# Patient Record
Sex: Male | Born: 1970 | Race: Black or African American | Hispanic: No | Marital: Married | Smoking: Never smoker
Health system: Southern US, Community
[De-identification: ages and names within clinical notes are randomized; demographics above are authoritative.]

---

## 2018-06-13 ENCOUNTER — Emergency Department (HOSPITAL_COMMUNITY): Payer: Commercial Managed Care - PPO

## 2018-06-13 ENCOUNTER — Inpatient Hospital Stay (HOSPITAL_COMMUNITY)
Admission: EM | Admit: 2018-06-13 | Discharge: 2018-06-16 | DRG: 052 | Disposition: A | Discharge status: Home Health | Payer: Commercial Managed Care - PPO | Attending: Neurological Surgery | Admitting: Neurological Surgery

## 2018-06-13 ENCOUNTER — Encounter (HOSPITAL_COMMUNITY): Payer: Self-pay | Admitting: Pharmacy Technician

## 2018-06-13 DIAGNOSIS — M5021 Other cervical disc displacement,  high cervical region: Secondary | ICD-10-CM | POA: Diagnosis present

## 2018-06-13 DIAGNOSIS — W1789XA Other fall from one level to another, initial encounter: Secondary | ICD-10-CM | POA: Diagnosis present

## 2018-06-13 DIAGNOSIS — I10 Essential (primary) hypertension: Secondary | ICD-10-CM | POA: Diagnosis present

## 2018-06-13 DIAGNOSIS — S34109A Unspecified injury to unspecified level of lumbar spinal cord, initial encounter: Secondary | ICD-10-CM | POA: Diagnosis present

## 2018-06-13 DIAGNOSIS — R52 Pain, unspecified: Secondary | ICD-10-CM

## 2018-06-13 DIAGNOSIS — F17200 Nicotine dependence, unspecified, uncomplicated: Secondary | ICD-10-CM | POA: Diagnosis present

## 2018-06-13 DIAGNOSIS — M5020 Other cervical disc displacement, unspecified cervical region: Secondary | ICD-10-CM

## 2018-06-13 DIAGNOSIS — S14129A Central cord syndrome at unspecified level of cervical spinal cord, initial encounter: Principal | ICD-10-CM | POA: Diagnosis present

## 2018-06-13 DIAGNOSIS — T380X5A Adverse effect of glucocorticoids and synthetic analogues, initial encounter: Secondary | ICD-10-CM | POA: Diagnosis not present

## 2018-06-13 DIAGNOSIS — G8222 Paraplegia, incomplete: Secondary | ICD-10-CM | POA: Diagnosis present

## 2018-06-13 DIAGNOSIS — S12000A Unspecified displaced fracture of first cervical vertebra, initial encounter for closed fracture: Secondary | ICD-10-CM | POA: Diagnosis present

## 2018-06-13 DIAGNOSIS — Y9344 Activity, trampolining: Secondary | ICD-10-CM

## 2018-06-13 DIAGNOSIS — M4802 Spinal stenosis, cervical region: Secondary | ICD-10-CM | POA: Diagnosis present

## 2018-06-13 DIAGNOSIS — Y92838 Other recreation area as the place of occurrence of the external cause: Secondary | ICD-10-CM

## 2018-06-13 DIAGNOSIS — S12001A Unspecified nondisplaced fracture of first cervical vertebra, initial encounter for closed fracture: Secondary | ICD-10-CM

## 2018-06-13 DIAGNOSIS — R739 Hyperglycemia, unspecified: Secondary | ICD-10-CM

## 2018-06-13 LAB — BASIC METABOLIC PANEL
Anion gap: 11 (ref 5–15)
BUN: 13 mg/dL (ref 6–20)
CO2: 22 mmol/L (ref 22–32)
Calcium: 9 mg/dL (ref 8.9–10.3)
Chloride: 105 mmol/L (ref 98–111)
Creatinine, Ser: 1.29 mg/dL — ABNORMAL HIGH (ref 0.61–1.24)
GFR calc Af Amer: >60 mL/min (ref >60)
GFR calc non Af Amer: >60 mL/min (ref >60)
Glucose, Bld: 125 mg/dL — ABNORMAL HIGH (ref 70–99)
Potassium: 3.6 mmol/L (ref 3.5–5.1)
Sodium: 138 mmol/L (ref 135–145)

## 2018-06-13 LAB — CBC WITH DIFFERENTIAL/PLATELET
Abs Immature Granulocytes: 0 10*3/uL (ref 0.0–0.1)
Basophils Absolute: 0.1 10*3/uL (ref 0.0–0.1)
Basophils Relative: 1 %
Eosinophils Absolute: 0.2 10*3/uL (ref 0.0–0.7)
Eosinophils Relative: 3 %
HCT: 42.5 % (ref 39.0–52.0)
Hemoglobin: 13.8 g/dL (ref 13.0–17.0)
Immature Granulocytes: 0 %
Lymphocytes Relative: 59 %
Lymphs Abs: 3.6 10*3/uL (ref 0.7–4.0)
MCH: 27.9 pg (ref 26.0–34.0)
MCHC: 32.5 g/dL (ref 30.0–36.0)
MCV: 86 fL (ref 78.0–100.0)
Monocytes Absolute: 0.8 10*3/uL (ref 0.1–1.0)
Monocytes Relative: 12 %
Neutro Abs: 1.6 10*3/uL — ABNORMAL LOW (ref 1.7–7.7)
Neutrophils Relative %: 25 %
Platelets: 217 10*3/uL (ref 150–400)
RBC: 4.94 MIL/uL (ref 4.22–5.81)
RDW: 13.8 % (ref 11.5–15.5)
WBC: 6.2 10*3/uL (ref 4.0–10.5)

## 2018-06-13 LAB — TROPONIN I: Troponin I: <0.03 ng/mL (ref <0.03)

## 2018-06-13 MED ORDER — IOPAMIDOL (ISOVUE-370) INJECTION 76%
50.0000 mL | Freq: Once | INTRAVENOUS | Status: AC | PRN
  Administered 2018-06-13: 50 mL via INTRAVENOUS

## 2018-06-13 MED ORDER — HYDROMORPHONE HCL 1 MG/ML IJ SOLN
1.0000 mg | Freq: Once | INTRAMUSCULAR | Status: AC
  Administered 2018-06-13: 1 mg via INTRAVENOUS
  Filled 2018-06-13: qty 1

## 2018-06-13 MED ORDER — LORAZEPAM 2 MG/ML IJ SOLN
1.0000 mg | Freq: Once | INTRAMUSCULAR | Status: AC | PRN
  Administered 2018-06-13: 1 mg via INTRAVENOUS
  Filled 2018-06-13: qty 1

## 2018-06-13 MED ORDER — MORPHINE SULFATE (PF) 4 MG/ML IV SOLN
4.0000 mg | Freq: Once | INTRAVENOUS | Status: AC
  Administered 2018-06-13: 4 mg via INTRAVENOUS
  Filled 2018-06-13: qty 1

## 2018-06-13 MED ORDER — LORAZEPAM 2 MG/ML IJ SOLN
1.0000 mg | Freq: Once | INTRAMUSCULAR | Status: AC | PRN
  Administered 2018-06-14: 1 mg via INTRAVENOUS
  Filled 2018-06-13 (×2): qty 1

## 2018-06-13 MED ORDER — MORPHINE SULFATE (PF) 4 MG/ML IV SOLN
6.0000 mg | Freq: Once | INTRAVENOUS | Status: AC
  Administered 2018-06-13: 6 mg via INTRAVENOUS
  Filled 2018-06-13: qty 2

## 2018-06-13 NOTE — ED Notes (Signed)
This RN accompanied pt to CT 

## 2018-06-13 NOTE — ED Notes (Signed)
Pain reported to Dr. Juleen ChinaKohut

## 2018-06-13 NOTE — ED Triage Notes (Signed)
Pt BIB EMS from trampoline park where pt was jumping and flipping. Pt landed wrong during a flip and landed on his neck. EMS reports bystanders heard a loud "popping" noise when pt landed. Pt reports +LOC. C/o pain to bil shoulders and bil arms. Pt given fentanyl en route. 138/90, HR 70, 98% RA, RR 20. 20g Lhand.

## 2018-06-13 NOTE — Progress Notes (Signed)
   06/13/18 1900  Clinical Encounter Type  Visited With Patient;Family;Health care provider  Visit Type ED  Referral From Nurse  Consult/Referral To Chaplain   Responded to a Level II.  Patient arrived and I spoke with him and he stated wife and daughters on the way. Patient being evaluated and I met the wife and family and got them situation.  Provided hospitality to the family.  Let staff know where family was waiting.  Escorted family to see the patient.  Will follow and support as needed. Chaplain Katherene Ponto

## 2018-06-13 NOTE — ED Provider Notes (Signed)
[facility]   CSN: [id number] Arrival date & time: 06/13/18  1739     History   Chief Complaint No chief complaint on file.   HPI Marcus Adkins is a 47 y.o. male.  HPI   47yM LEVEL 2 trauma.  Patient was at a trampoline park and attempted to do a flip.  He landed on his upper back/neck.  He had acute onset of neck pain in numbness and burning in bilateral upper extremities and some weakness in his right arm.  Symptoms have been persistent since onset.  He is placed in a c-collar by EMS and given 100 mg of fentanyl.  Reports that symptoms have been relatively stable.  Denies any numbness tingling or focal loss of strength elsewhere.  Denies any significant headache.  Denies any significant past medical history.  No past medical history on file.  There are no active problems to display for this patient.       Home Medications    Prior to Admission medications   Not on File    Family History No family history on file.  Social History Social History   Tobacco Use  . Smoking status: Not on file  Substance Use Topics  . Alcohol use: Not on file  . Drug use: Not on file     Allergies   Patient has no allergy information on record.   Review of Systems Review of Systems  All systems reviewed and negative, other than as noted in HPI. Physical Exam Updated Vital Signs SpO2 100%   Physical Exam  Constitutional: He appears well-developed and well-nourished. No distress.  HENT:  Head: Normocephalic and atraumatic.  Eyes: Conjunctivae are normal. Right eye exhibits no discharge. Left eye exhibits no discharge.  Neck: Neck supple.  Cardiovascular: Normal rate, regular rhythm and normal heart sounds. Exam reveals no gallop and no friction rub.  No murmur heard. Pulmonary/Chest: Effort normal and breath sounds normal. No respiratory distress.  Abdominal: Soft. He exhibits no distension. There is no  tenderness.  Musculoskeletal: He exhibits no edema or tenderness.  Cervical collar in place.  He does not have any midline tenderness in the lower cervical spine or thoracic or lumbar spine.  Neurological: He is alert.  Alert.  Oriented.  Speech clear.  Following commands.  Cranial nerves II through XII intact.  He has hyperesthesia in bilateral upper extremities, right greater than left.  This extends from his hand up into the deltoid region.  He does seem to be somewhat worse on the right side over the dorsal aspect of his forearm.  He has decreased grip strength on the right and decreased flexion and extension of the right arm.  Normal shoulder shrug.  Strength is 5 out of 5 bilateral lower extremities.  Skin: Skin is warm and dry.  Psychiatric: He has a normal mood and affect. His behavior is normal. Thought content normal.  Nursing note and vitals reviewed.    ED Treatments / Results  Labs (all labs ordered are listed, but only abnormal results are displayed) Labs Reviewed - No data to display  EKG None  Radiology Ct Head Wo Contrast  Result Date: 06/13/2018 CLINICAL DATA:  from trampoline park where pt was jumping and flipping. Pt landed wrong during a flip and landed on his neck. EMS reports bystanders heard a loud "popping" noise when pt landed. Pt reports loss of consciousness. C/o pain to bilateral shoulders and bilateral arms. EXAM: CT HEAD WITHOUT  CONTRAST CT CERVICAL SPINE WITHOUT CONTRAST TECHNIQUE: Multidetector CT imaging of the head and cervical spine was performed following the standard protocol without intravenous contrast. Multiplanar CT image reconstructions of the cervical spine were also generated. COMPARISON:  None. FINDINGS: CT HEAD FINDINGS Brain: No evidence of acute infarction, hemorrhage, hydrocephalus, extra-axial collection or mass lesion/mass effect. Vascular: No hyperdense vessel or unexpected calcification. Skull: Normal. Negative for fracture or focal lesion.  Sinuses/Orbits: There is mucosal thickening of the ethmoid sinuses. Other: None CT CERVICAL SPINE FINDINGS Alignment: There is loss of cervical lordosis. This may be secondary to splinting, soft tissue injury, or positioning. Skull base and vertebrae: There is a fracture of the superior aspect of the RIGHT transverse process at C1, involving the UPPER aspect of the transverse foramen and the groove for the vertebral artery. The posterior arch of C1 is intact. No other fractures are identified. Soft tissues and spinal canal: There is low-attenuation within a RIGHT paraspinous muscle at the level of C3. Disc levels:  Mild degenerative changes in the mid cervical spine. Upper chest: Negative. Other: None IMPRESSION: 1. Acute fracture of the RIGHT transverse process of C1 which has a close proximity with the RIGHT vertebral artery. Further evaluation with CT angiography is recommended. 2. Focal low-attenuation within a paraspinous muscle on the RIGHT at the level of C3, likely related to muscle strain. 3. No evidence for acute intracranial abnormality. 4. Sinus disease. Critical Value/emergent results were called by telephone at the time of interpretation on 06/13/2018 at 6:53 pm to Dr. Raeford Razor , who verbally acknowledged these results. Electronically Signed   By: Norva Pavlov M.D.   On: 06/13/2018 18:54   Ct Angio Neck W And/or Wo Contrast  Result Date: 06/13/2018 CLINICAL DATA:  C1 fracture affecting the transverse foramen. Assess for vertebral injury. EXAM: CT ANGIOGRAPHY NECK TECHNIQUE: Multidetector CT imaging of the neck was performed using the standard protocol during bolus administration of intravenous contrast. Multiplanar CT image reconstructions and MIPs were obtained to evaluate the vascular anatomy. Carotid stenosis measurements (when applicable) are obtained utilizing NASCET criteria, using the distal internal carotid diameter as the denominator. CONTRAST:  50mL ISOVUE-370 IOPAMIDOL  (ISOVUE-370) INJECTION 76% COMPARISON:  CT earlier same day. FINDINGS: Aortic arch: Normal. Branching pattern of the brachiocephalic vessels is normal without origin stenosis. Right carotid system: Common carotid artery is widely patent to the bifurcation. Carotid bifurcation is normal. Cervical ICA is tortuous but widely patent to and through the skull base. Left carotid system: Common carotid artery widely patent to the bifurcation. No carotid bifurcation atherosclerosis. Cervical ICA is tortuous but widely patent to and through the bifurcation. Vertebral arteries: Both vertebral artery origins are widely patent. The right vertebral artery is dominant. Both vertebral arteries are widely patent through the cervical region to the foramen magnum and the basilar. At the site of the right foramen transversarium fracture, the vertebral artery shows the most minimal contour irregularity most consistent with minor focal spasm. No evidence of a definite intimal injury or dissection. Skeleton: Right foramen transversarium fracture at C1 as noted previously. No other cervical spine fracture. Ordinary mild degenerative spondylosis C4-5, C5-6 and C6-7. Other neck: No evidence of canal hematoma. Upper chest: Negative IMPRESSION: At the site of the foramen transversarium fracture of C1 on the right, there is the most minimal contour deformity of the right vertebral artery, most consistent with minimal spasm. I do not see a definite intimal injury. The vessel is widely patent through the region. Electronically Signed  By: Paulina Fusi M.D.   On: 06/13/2018 19:51   Ct Cervical Spine Wo Contrast  Result Date: 06/13/2018 CLINICAL DATA:  from trampoline park where pt was jumping and flipping. Pt landed wrong during a flip and landed on his neck. EMS reports bystanders heard a loud "popping" noise when pt landed. Pt reports loss of consciousness. C/o pain to bilateral shoulders and bilateral arms. EXAM: CT HEAD WITHOUT CONTRAST  CT CERVICAL SPINE WITHOUT CONTRAST TECHNIQUE: Multidetector CT imaging of the head and cervical spine was performed following the standard protocol without intravenous contrast. Multiplanar CT image reconstructions of the cervical spine were also generated. COMPARISON:  None. FINDINGS: CT HEAD FINDINGS Brain: No evidence of acute infarction, hemorrhage, hydrocephalus, extra-axial collection or mass lesion/mass effect. Vascular: No hyperdense vessel or unexpected calcification. Skull: Normal. Negative for fracture or focal lesion. Sinuses/Orbits: There is mucosal thickening of the ethmoid sinuses. Other: None CT CERVICAL SPINE FINDINGS Alignment: There is loss of cervical lordosis. This may be secondary to splinting, soft tissue injury, or positioning. Skull base and vertebrae: There is a fracture of the superior aspect of the RIGHT transverse process at C1, involving the UPPER aspect of the transverse foramen and the groove for the vertebral artery. The posterior arch of C1 is intact. No other fractures are identified. Soft tissues and spinal canal: There is low-attenuation within a RIGHT paraspinous muscle at the level of C3. Disc levels:  Mild degenerative changes in the mid cervical spine. Upper chest: Negative. Other: None IMPRESSION: 1. Acute fracture of the RIGHT transverse process of C1 which has a close proximity with the RIGHT vertebral artery. Further evaluation with CT angiography is recommended. 2. Focal low-attenuation within a paraspinous muscle on the RIGHT at the level of C3, likely related to muscle strain. 3. No evidence for acute intracranial abnormality. 4. Sinus disease. Critical Value/emergent results were called by telephone at the time of interpretation on 06/13/2018 at 6:53 pm to Dr. Raeford Razor , who verbally acknowledged these results. Electronically Signed   By: Norva Pavlov M.D.   On: 06/13/2018 18:54    Procedures Procedures (including critical care time)  CRITICAL  CARE Performed by: Raeford Razor Total critical care time: 40 minutes Critical care time was exclusive of separately billable procedures and treating other patients. Critical care was necessary to treat or prevent imminent or life-threatening deterioration. Critical care was time spent personally by me on the following activities: development of treatment plan with patient and/or surrogate as well as nursing, discussions with consultants, evaluation of patient's response to treatment, examination of patient, obtaining history from patient or surrogate, ordering and performing treatments and interventions, ordering and review of laboratory studies, ordering and review of radiographic studies, pulse oximetry and re-evaluation of patient's condition.  Medications Ordered in ED Medications  HYDROmorphone (DILAUDID) injection 1 mg (has no administration in time range)     Initial Impression / Assessment and Plan / ED Course  I have reviewed the triage vital signs and the nursing notes.  Pertinent labs & imaging results that were available during my care of the patient were reviewed by me and considered in my medical decision making (see chart for details).     47yM with neck pain, hyperesthesia of upper extremities and weakness R>L. Clinically has cervical cord injury. Will place in an aspen collar. Will start with CT. Anticipate MRI and neurosurgery consultation.   Interestingly his EKG looks concerning in isolation. Clinically this is not a STEMI though.  I asked him  again about CP, dyspnea, etc which he continues to deny. No old for comparison.   Initial CT as above. Symptoms and exam more consistent with cord injury although is doesn't correspond as much with imaging. Discussed with radiology. Recommending CTa to assess vertebral artery. Will discuss with neurosurgery. Will MR neck as clinically he has a cord injury.   Discussed with Dr Danielle Dess, neurosuregry. Almira Coaster with imaging and will    Final Clinical Impressions(s) / ED Diagnoses   Final diagnoses:  Closed nondisplaced fracture of first cervical vertebra, unspecified fracture morphology, initial encounter (HCC)  Protrusion of cervical intervertebral disc    ED Discharge Orders    None       Raeford Razor, MD 06/14/18 [phone]

## 2018-06-14 ENCOUNTER — Encounter (HOSPITAL_COMMUNITY): Payer: Self-pay | Admitting: *Deleted

## 2018-06-14 ENCOUNTER — Other Ambulatory Visit: Payer: Self-pay

## 2018-06-14 ENCOUNTER — Emergency Department (HOSPITAL_COMMUNITY): Payer: Commercial Managed Care - PPO

## 2018-06-14 DIAGNOSIS — S34109A Unspecified injury to unspecified level of lumbar spinal cord, initial encounter: Secondary | ICD-10-CM | POA: Diagnosis not present

## 2018-06-14 DIAGNOSIS — T380X5A Adverse effect of glucocorticoids and synthetic analogues, initial encounter: Secondary | ICD-10-CM | POA: Diagnosis not present

## 2018-06-14 DIAGNOSIS — R52 Pain, unspecified: Secondary | ICD-10-CM | POA: Diagnosis not present

## 2018-06-14 DIAGNOSIS — Y9344 Activity, trampolining: Secondary | ICD-10-CM | POA: Diagnosis not present

## 2018-06-14 DIAGNOSIS — R739 Hyperglycemia, unspecified: Secondary | ICD-10-CM | POA: Diagnosis not present

## 2018-06-14 DIAGNOSIS — S14129A Central cord syndrome at unspecified level of cervical spinal cord, initial encounter: Secondary | ICD-10-CM | POA: Diagnosis present

## 2018-06-14 DIAGNOSIS — Y92838 Other recreation area as the place of occurrence of the external cause: Secondary | ICD-10-CM | POA: Diagnosis not present

## 2018-06-14 DIAGNOSIS — W1789XA Other fall from one level to another, initial encounter: Secondary | ICD-10-CM | POA: Diagnosis present

## 2018-06-14 DIAGNOSIS — I10 Essential (primary) hypertension: Secondary | ICD-10-CM | POA: Diagnosis present

## 2018-06-14 DIAGNOSIS — M5020 Other cervical disc displacement, unspecified cervical region: Secondary | ICD-10-CM | POA: Diagnosis not present

## 2018-06-14 DIAGNOSIS — S12000A Unspecified displaced fracture of first cervical vertebra, initial encounter for closed fracture: Secondary | ICD-10-CM | POA: Diagnosis present

## 2018-06-14 DIAGNOSIS — F17200 Nicotine dependence, unspecified, uncomplicated: Secondary | ICD-10-CM | POA: Diagnosis present

## 2018-06-14 DIAGNOSIS — G8222 Paraplegia, incomplete: Secondary | ICD-10-CM | POA: Diagnosis present

## 2018-06-14 DIAGNOSIS — M4802 Spinal stenosis, cervical region: Secondary | ICD-10-CM | POA: Diagnosis present

## 2018-06-14 DIAGNOSIS — S12001A Unspecified nondisplaced fracture of first cervical vertebra, initial encounter for closed fracture: Secondary | ICD-10-CM | POA: Diagnosis present

## 2018-06-14 DIAGNOSIS — M5021 Other cervical disc displacement,  high cervical region: Secondary | ICD-10-CM | POA: Diagnosis present

## 2018-06-14 LAB — MRSA PCR SCREENING: MRSA BY PCR: NEGATIVE

## 2018-06-14 LAB — CBC
HCT: 43 % (ref 39.0–52.0)
Hemoglobin: 13.7 g/dL (ref 13.0–17.0)
MCH: 27.6 pg (ref 26.0–34.0)
MCHC: 31.9 g/dL (ref 30.0–36.0)
MCV: 86.7 fL (ref 78.0–100.0)
PLATELETS: 224 10*3/uL (ref 150–400)
RBC: 4.96 MIL/uL (ref 4.22–5.81)
RDW: 14.3 % (ref 11.5–15.5)
WBC: 9 10*3/uL (ref 4.0–10.5)

## 2018-06-14 LAB — CREATININE, SERUM
Creatinine, Ser: 1.05 mg/dL (ref 0.61–1.24)
GFR calc Af Amer: >60 mL/min (ref >60)
GFR calc non Af Amer: >60 mL/min (ref >60)

## 2018-06-14 MED ORDER — GABAPENTIN 600 MG PO TABS
300.0000 mg | ORAL_TABLET | Freq: Four times a day (QID) | ORAL | Status: DC | PRN
  Administered 2018-06-14: 300 mg via ORAL
  Filled 2018-06-14: qty 1

## 2018-06-14 MED ORDER — SODIUM CHLORIDE 0.9 % IV SOLN
INTRAVENOUS | Status: DC
  Administered 2018-06-14: 04:00:00 via INTRAVENOUS

## 2018-06-14 MED ORDER — MORPHINE SULFATE (PF) 4 MG/ML IV SOLN
4.0000 mg | Freq: Once | INTRAVENOUS | Status: AC
  Administered 2018-06-14: 4 mg via INTRAVENOUS
  Filled 2018-06-14: qty 1

## 2018-06-14 MED ORDER — DEXAMETHASONE SODIUM PHOSPHATE 10 MG/ML IJ SOLN
10.0000 mg | Freq: Once | INTRAMUSCULAR | Status: AC
  Administered 2018-06-14: 10 mg via INTRAVENOUS
  Filled 2018-06-14: qty 1

## 2018-06-14 MED ORDER — DEXAMETHASONE 2 MG PO TABS
2.0000 mg | ORAL_TABLET | Freq: Three times a day (TID) | ORAL | Status: DC
  Administered 2018-06-14 – 2018-06-16 (×7): 2 mg via ORAL
  Filled 2018-06-14 (×7): qty 1

## 2018-06-14 MED ORDER — MORPHINE SULFATE (PF) 2 MG/ML IV SOLN
2.0000 mg | INTRAVENOUS | Status: DC | PRN
  Administered 2018-06-14 – 2018-06-16 (×15): 2 mg via INTRAVENOUS
  Filled 2018-06-14 (×15): qty 1

## 2018-06-14 MED ORDER — ENOXAPARIN SODIUM 40 MG/0.4ML ~~LOC~~ SOLN
40.0000 mg | SUBCUTANEOUS | Status: DC
  Administered 2018-06-14 – 2018-06-16 (×3): 40 mg via SUBCUTANEOUS
  Filled 2018-06-14 (×3): qty 0.4

## 2018-06-14 MED ORDER — GABAPENTIN 600 MG PO TABS
300.0000 mg | ORAL_TABLET | Freq: Three times a day (TID) | ORAL | Status: DC
  Administered 2018-06-14 – 2018-06-16 (×7): 300 mg via ORAL
  Filled 2018-06-14 (×7): qty 1

## 2018-06-14 MED ORDER — ENOXAPARIN SODIUM 40 MG/0.4ML ~~LOC~~ SOLN: 40.0000 mg | SUBCUTANEOUS | Status: DC

## 2018-06-14 NOTE — Progress Notes (Signed)
Physical Therapy Evaluation Patient Details Name: Marcus Adkins MRN: [id number] DOB: 12/03/1970 Today's Date: 06/14/2018   History of Present Illness  pt is a 47 y/o male who suffured a spinal cord injury sustained at a trampoline park when he misjudged a somersault landing on his neck and shoulders.  He immediately noted paresis/paralysis in bil UE and LE's.  CT showed C! fx, MRI shows mild to moderate stenosis C2-C7.  Clinical Impression  Pt admitted with/for spinal cord injury.  Pt's UE function significantly limited while function in the LE has largely returned.  Pt currently limited functionally due to the problems listed below.  (see problems list.)  Pt will benefit from PT to maximize function and safety to be able to get home safely with available assist.     Follow Up Recommendations Other (comment)(pt will eventually need post acute therapy,  TBA next treatm)    Equipment Recommendations  None recommended by PT    Recommendations for Other Services Rehab consult     Precautions / Restrictions Precautions Required Braces or Orthoses: Cervical Brace Cervical Brace: Hard collar;At all times Restrictions Weight Bearing Restrictions: No      Mobility  Bed Mobility               General bed mobility comments: NT  up in the chair  Transfers Overall transfer level: Needs assistance   Transfers: Sit to/from Stand Sit to Stand: Min guard         General transfer comment: no use of hands  Ambulation/Gait Ambulation/Gait assistance: Min assist;Min guard Gait Distance (Feet): 400 Feet Assistive device: None Gait Pattern/deviations: Step-through pattern   Gait velocity interpretation: <1.8 ft/sec, indicate of risk for recurrent falls General Gait Details: generally steady.  Arms dangling and held stiffly.  Pain increased in the arms, neck and shoulders with gait.  Stairs            Wheelchair Mobility    Modified Rankin (Stroke Patients Only)        Balance Overall balance assessment: Needs assistance   Sitting balance-Leahy Scale: Good       Standing balance-Leahy Scale: Fair(to good)                               Pertinent Vitals/Pain Pain Assessment: Faces Faces Pain Scale: Hurts even more Pain Location: UE's, Neck Pain Descriptors / Indicators: Pins and needles;Tingling;Discomfort;Burning Pain Intervention(s): Monitored during session;Repositioned    Home Living Family/patient expects to be discharged to:: Private residence Living Arrangements: Spouse/significant other;Children Available Help at Discharge: Family;Available 24 hours/day;Available PRN/intermittently Type of Home: House Home Access: Stairs to enter Entrance Stairs-Rails: Doctor, general practiceight;Left Entrance Stairs-Number of Steps: several Home Layout: One level Home Equipment: None      Prior Function Level of Independence: Independent         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     Hand Dominance        Extremity/Trunk Assessment   Upper Extremity Assessment Upper Extremity Assessment: Defer to OT evaluation(R significantly worse strength than L UE, )    Lower Extremity Assessment Lower Extremity Assessment: Overall WFL for tasks assessed       Communication   Communication: No difficulties  Cognition Arousal/Alertness: Awake/alert Behavior During Therapy: WFL for tasks assessed/performed Overall Cognitive Status: Within Functional Limits for tasks assessed  General Comments General comments (skin integrity, edema, etc.): Initiated and Advised cervical precautions    Exercises     Assessment/Plan    PT Assessment Patient needs continued PT services  PT Problem List Decreased strength;Decreased mobility;Decreased coordination;Decreased knowledge of precautions;Impaired sensation;Pain;Decreased activity tolerance       PT Treatment Interventions Gait  training;Stair training;Functional mobility training;Therapeutic activities;Patient/family education    PT Goals (Current goals can be found in the Care Plan section)  Acute Rehab PT Goals Patient Stated Goal: back to work,  my (UE) function back PT Goal Formulation: With patient Time For Goal Achievement: 06/28/18 Potential to Achieve Goals: Good    Frequency Min 4X/week   Barriers to discharge        Co-evaluation               AM-PAC PT "6 Clicks" Daily Activity  Outcome Measure Difficulty turning over in bed (including adjusting bedclothes, sheets and blankets)?: A Little Difficulty moving from lying on back to sitting on the side of the bed? : A Little Difficulty sitting down on and standing up from a chair with arms (e.g., wheelchair, bedside commode, etc,.)?: A Little Help needed moving to and from a bed to chair (including a wheelchair)?: A Little Help needed walking in hospital room?: A Little Help needed climbing 3-5 steps with a railing? : A Little 6 Click Score: 18    End of Session   Activity Tolerance: Patient tolerated treatment well;Patient limited by pain Patient left: in chair;with call bell/phone within reach;with family/visitor present Nurse Communication: Mobility status PT Visit Diagnosis: Other abnormalities of gait and mobility (R26.89);Pain;Other symptoms and signs involving the nervous system (R29.898) Pain - Right/Left: (BIL) Pain - part of body: Shoulder;Arm;Hand    Time: 1610-9604 PT Time Calculation (min) (ACUTE ONLY): 22 min   Charges:   PT Evaluation $PT Eval Moderate Complexity: 1 Mod     PT G Codes:        2018-06-19  Ackley Bing, PT [phone]  (pager)  Eliseo Gum Aamari West 06/19/18, 6:14 PM

## 2018-06-14 NOTE — H&P (Signed)
Marcus Adkins is an 47 y.o. male.   Chief Complaint: Severe pain and weakness in the upper extremities after trampoline injury HPI: The patient is a 47 year old individual who tells me that he was on a trampoline at a trampoline park yesterday.  He did a double somersault and misjudged his landing.  He notes that his neck was flexed when he landed on his neck and shoulders.  He felt immediately paralyzed but had been noted to move his legs at the scene he complained severely of pain in his upper extremities with severe dysesthesias and inability to move his hands is brought to the emergency department or ultimately a CT scan demonstrated the presence of a foramen transversarium fracture that was minimally displaced.  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  He is admitted now for treatment of his central cord injury  History reviewed. No pertinent past medical history.  History reviewed. No pertinent surgical history.  History reviewed. No pertinent family history. Social History:  reports that he has never smoked. He has never used smokeless tobacco. His alcohol and drug histories are not on file.  Allergies: No Known Allergies  No medications prior to admission.    Results for orders placed or performed during the hospital encounter of 06/13/18 (from the past 48 hour(s))  CBC with Differential     Status: Abnormal   Collection Time: 06/13/18  5:55 PM  Result Value Ref Range   WBC 6.2 4.0 - 10.5 K/uL   RBC 4.94 4.22 - 5.81 MIL/uL   Hemoglobin 13.8 13.0 - 17.0 g/dL   HCT 42.5 39.0 - 52.0 %   MCV 86.0 78.0 - 100.0 fL   MCH 27.9 26.0 - 34.0 pg   MCHC 32.5 30.0 - 36.0 g/dL   RDW 13.8 11.5 - 15.5 %   Platelets 217 150 - 400 K/uL   Neutrophils Relative % 25 %   Neutro Abs 1.6 (L) 1.7 - 7.7 K/uL   Lymphocytes Relative 59 %   Lymphs Abs 3.6 0.7 - 4.0 K/uL    Monocytes Relative 12 %   Monocytes Absolute 0.8 0.1 - 1.0 K/uL   Eosinophils Relative 3 %   Eosinophils Absolute 0.2 0.0 - 0.7 K/uL   Basophils Relative 1 %   Basophils Absolute 0.1 0.0 - 0.1 K/uL   Immature Granulocytes 0 %   Abs Immature Granulocytes 0.0 0.0 - 0.1 K/uL    Comment: Performed at [facility], [address] [place], [place] [address]  Basic metabolic panel     Status: Abnormal   Collection Time: 06/13/18  5:55 PM  Result Value Ref Range   Sodium 138 135 - 145 mmol/L   Potassium 3.6 3.5 - 5.1 mmol/L   Chloride 105 98 - 111 mmol/L    Comment: Please note change in reference range.   CO2 22 22 - 32 mmol/L   Glucose, Bld 125 (H) 70 - 99 mg/dL    Comment: Please note change in reference range.   BUN 13 6 - 20 mg/dL    Comment: Please note change in reference range.   Creatinine, Ser 1.29 (H) 0.61 - 1.24 mg/dL   Calcium 9.0 8.9 - 10.3 mg/dL   GFR calc non Af Amer >60 >60 mL/min   GFR calc Af Amer >60 >60 mL/min    Comment: (NOTE) The eGFR has been calculated using  the CKD EPI equation. This calculation has not been validated in all clinical situations. eGFR's persistently <60 mL/min signify possible Chronic Kidney Disease.    Anion gap 11 5 - 15    Comment: Performed at [facility] [address], [place], [place] [address]  Troponin I     Status: None   Collection Time: 06/13/18  5:55 PM  Result Value Ref Range   Troponin I <0.03 <0.03 ng/mL    Comment: Performed at [facility] [address] [place], [place] [address]  MRSA PCR Screening     Status: None   Collection Time: 06/14/18  3:21 AM  Result Value Ref Range   MRSA by PCR NEGATIVE NEGATIVE    Comment:        The GeneXpert MRSA Assay (FDA approved for NASAL specimens only), is one component of a comprehensive MRSA colonization surveillance program. It is not intended to diagnose MRSA infection nor to guide or monitor treatment for MRSA infections. Performed at [facility], [place] [address] [place] [address]   CBC     Status: None   Collection Time: 06/14/18  8:37 AM  Result Value Ref Range   WBC 9.0 4.0 - 10.5 K/uL   RBC 4.96 4.22 - 5.81 MIL/uL   Hemoglobin 13.7 13.0 - 17.0 g/dL   HCT 43.0 39.0 - 52.0 %   MCV 86.7 78.0 - 100.0 fL   MCH 27.6 26.0 - 34.0 pg   MCHC 31.9 30.0 - 36.0 g/dL   RDW 14.3 11.5 - 15.5 %   Platelets 224 150 - 400 K/uL    Comment: Performed at [facility], [place] [address], [place], [place] [address]  Creatinine, serum     Status: None   Collection Time: 06/14/18  8:37 AM  Result Value Ref Range   Creatinine, Ser 1.05 0.61 - 1.24 mg/dL   GFR calc non Af Amer >60 >60 mL/min   GFR calc Af Amer >60 >60 mL/min    Comment: (NOTE) The eGFR has been calculated using the CKD EPI equation. This calculation has not been validated in all clinical situations. eGFR's persistently <60 mL/min signify possible Chronic Kidney Disease. Performed at [facility], [place] [address] [place], [place] [address]    Ct Head Wo Contrast  Result Date: 06/13/2018 CLINICAL DATA:  from trampoline park where pt was jumping and flipping. Pt landed wrong during a flip and landed on his neck. EMS reports bystanders heard a loud "popping" noise when pt landed. Pt reports loss of consciousness. C/o pain to bilateral shoulders and bilateral arms. EXAM: CT HEAD WITHOUT CONTRAST CT CERVICAL SPINE WITHOUT CONTRAST TECHNIQUE: Multidetector CT imaging of the head and cervical spine was performed following the standard protocol without intravenous contrast. Multiplanar CT image reconstructions of the cervical spine were also generated. COMPARISON:  None. FINDINGS: CT HEAD FINDINGS Brain: No evidence of acute infarction, hemorrhage, hydrocephalus, extra-axial collection or mass lesion/mass effect. Vascular: No hyperdense vessel or unexpected calcification. Skull: Normal. Negative for fracture or focal lesion. Sinuses/Orbits: There is mucosal  thickening of the ethmoid sinuses. Other: None CT CERVICAL SPINE FINDINGS Alignment: There is loss of cervical lordosis. This may be secondary to splinting, soft tissue injury, or positioning. Skull base and vertebrae: There is a fracture of the superior aspect of the RIGHT transverse process at C1, involving the UPPER aspect of the transverse foramen and the groove for the vertebral artery. The posterior arch  of C1 is intact. No other fractures are identified. Soft tissues and spinal canal: There is low-attenuation within a RIGHT paraspinous muscle at the level of C3. Disc levels:  Mild degenerative changes in the mid cervical spine. Upper chest: Negative. Other: None IMPRESSION: 1. Acute fracture of the RIGHT transverse process of C1 which has a close proximity with the RIGHT vertebral artery. Further evaluation with CT angiography is recommended. 2. Focal low-attenuation within a paraspinous muscle on the RIGHT at the level of C3, likely related to muscle strain. 3. No evidence for acute intracranial abnormality. 4. Sinus disease. Critical Value/emergent results were called by telephone at the time of interpretation on 06/13/2018 at 6:53 pm to Dr. Virgel Manifold , who verbally acknowledged these results. Electronically Signed   By: Nolon Nations M.D.   On: 06/13/2018 18:54   Ct Angio Neck W And/or Wo Contrast  Result Date: 06/13/2018 CLINICAL DATA:  C1 fracture affecting the transverse foramen. Assess for vertebral injury. EXAM: CT ANGIOGRAPHY NECK TECHNIQUE: Multidetector CT imaging of the neck was performed using the standard protocol during bolus administration of intravenous contrast. Multiplanar CT image reconstructions and MIPs were obtained to evaluate the vascular anatomy. Carotid stenosis measurements (when applicable) are obtained utilizing NASCET criteria, using the distal internal carotid diameter as the denominator. CONTRAST:  12m ISOVUE-370 IOPAMIDOL (ISOVUE-370) INJECTION 76% COMPARISON:  CT  earlier same day. FINDINGS: Aortic arch: Normal. Branching pattern of the brachiocephalic vessels is normal without origin stenosis. Right carotid system: Common carotid artery is widely patent to the bifurcation. Carotid bifurcation is normal. Cervical ICA is tortuous but widely patent to and through the skull base. Left carotid system: Common carotid artery widely patent to the bifurcation. No carotid bifurcation atherosclerosis. Cervical ICA is tortuous but widely patent to and through the bifurcation. Vertebral arteries: Both vertebral artery origins are widely patent. The right vertebral artery is dominant. Both vertebral arteries are widely patent through the cervical region to the foramen magnum and the basilar. At the site of the right foramen transversarium fracture, the vertebral artery shows the most minimal contour irregularity most consistent with minor focal spasm. No evidence of a definite intimal injury or dissection. Skeleton: Right foramen transversarium fracture at C1 as noted previously. No other cervical spine fracture. Ordinary mild degenerative spondylosis C4-5, C5-6 and C6-7. Other neck: No evidence of canal hematoma. Upper chest: Negative IMPRESSION: At the site of the foramen transversarium fracture of C1 on the right, there is the most minimal contour deformity of the right vertebral artery, most consistent with minimal spasm. I do not see a definite intimal injury. The vessel is widely patent through the region. Electronically Signed   By: MNelson ChimesM.D.   On: 06/13/2018 19:51   Ct Cervical Spine Wo Contrast  Result Date: 06/13/2018 CLINICAL DATA:  from trampoline park where pt was jumping and flipping. Pt landed wrong during a flip and landed on his neck. EMS reports bystanders heard a loud "popping" noise when pt landed. Pt reports loss of consciousness. C/o pain to bilateral shoulders and bilateral arms. EXAM: CT HEAD WITHOUT CONTRAST CT CERVICAL SPINE WITHOUT CONTRAST  TECHNIQUE: Multidetector CT imaging of the head and cervical spine was performed following the standard protocol without intravenous contrast. Multiplanar CT image reconstructions of the cervical spine were also generated. COMPARISON:  None. FINDINGS: CT HEAD FINDINGS Brain: No evidence of acute infarction, hemorrhage, hydrocephalus, extra-axial collection or mass lesion/mass effect. Vascular: No hyperdense vessel or unexpected calcification. Skull: Normal. Negative for fracture  or focal lesion. Sinuses/Orbits: There is mucosal thickening of the ethmoid sinuses. Other: None CT CERVICAL SPINE FINDINGS Alignment: There is loss of cervical lordosis. This may be secondary to splinting, soft tissue injury, or positioning. Skull base and vertebrae: There is a fracture of the superior aspect of the RIGHT transverse process at C1, involving the UPPER aspect of the transverse foramen and the groove for the vertebral artery. The posterior arch of C1 is intact. No other fractures are identified. Soft tissues and spinal canal: There is low-attenuation within a RIGHT paraspinous muscle at the level of C3. Disc levels:  Mild degenerative changes in the mid cervical spine. Upper chest: Negative. Other: None IMPRESSION: 1. Acute fracture of the RIGHT transverse process of C1 which has a close proximity with the RIGHT vertebral artery. Further evaluation with CT angiography is recommended. 2. Focal low-attenuation within a paraspinous muscle on the RIGHT at the level of C3, likely related to muscle strain. 3. No evidence for acute intracranial abnormality. 4. Sinus disease. Critical Value/emergent results were called by telephone at the time of interpretation on 06/13/2018 at 6:53 pm to Dr. Virgel Manifold , who verbally acknowledged these results. Electronically Signed   By: Nolon Nations M.D.   On: 06/13/2018 18:54   Mr Cervical Spine Wo Contrast  Result Date: 06/14/2018 CLINICAL DATA:  Neck pain after trampoline injury EXAM:  MRI CERVICAL SPINE WITHOUT CONTRAST TECHNIQUE: Multiplanar, multisequence MR imaging of the cervical spine was performed. No intravenous contrast was administered. COMPARISON:  CTA neck 06/13/2018 FINDINGS: Alignment: Normal. Vertebrae: No vertebral body height loss or focal marrow edema. The C1 fracture demonstrated on the CT scan is not visible on this study. There is a small prevertebral effusion extending from C1-2 to C5. No ligamentous disruption. Cord: Normal caliber and signal. Posterior Fossa, vertebral arteries, paraspinal tissues: Visualized posterior fossa is normal. Vertebral artery flow voids are preserved. Disc levels: The C1-2 articulation is normal. C2-C3: Small central disc protrusion without stenosis. C3-C4: Small disc bulge narrows the ventral thecal sac. Mild spinal canal stenosis. Mild narrowing of the left neural foramen. C4-C5: Small disc bulge with mild narrowing of the spinal canal and left neural foramen. C5-C6: Large central disc extrusion with superior migration to the C5 pedicle level. This moderately narrows the spinal canal and deforms the spinal cord. No neural foraminal stenosis. C6-C7: Medium-sized central disc extrusion with inferior migration narrowing the ventral thecal sac and indenting the right anterior aspect of the spinal cord. Mild central spinal canal stenosis. No neural impingement. C7-T1: Normal. T1-T3 is imaged in the sagittal plane only, with no visible disc herniation or stenosis. IMPRESSION: 1. Known C1 fracture is not demonstrated on this study. No ligamentous disruption. Small prevertebral effusion of the upper cervical spine. 2. Multilevel spinal canal stenosis, worst at C5-C6 where there is moderate central stenosis and deformity of the spinal cord without signal change. Electronically Signed   By: Ulyses Jarred M.D.   On: 06/14/2018 01:29    Review of Systems  Constitutional: Negative.   HENT: Negative.   Eyes: Negative.   Respiratory: Negative.    Cardiovascular: Negative.   Gastrointestinal: Negative.   Genitourinary: Negative.   Musculoskeletal: Negative.   Skin: Negative.   Neurological: Negative.   Endo/Heme/Allergies: Negative.   Psychiatric/Behavioral: Negative.     Blood pressure (!) 155/91, pulse 67, temperature 98.4 F (36.9 C), temperature source Oral, resp. rate 16, height '6\' 2"'  (1.88 m), weight 104.3 kg (230 lb), SpO2 93 %. Physical Exam  Constitutional: He appears well-developed and well-nourished.  HENT:  Head: Normocephalic and atraumatic.  Eyes: Pupils are equal, round, and reactive to light. Conjunctivae and EOM are normal.  Neck:  In hard cervical collar  Cardiovascular: Normal rate and regular rhythm.  Respiratory: Effort normal and breath sounds normal.  GI: Soft. Bowel sounds are normal.  Musculoskeletal:  Tender to palpation of supraclavicular fossa's.  Limited range of motion of neck secondary to collar.  Motor strength reveals deltoids strength at 4+ out of 5 biceps strength at 4+ out of 5 triceps is 3 out of 5 wrist extensor or 3 out of 5 intrinsic function of the hands is 2 out of 5  Neurological:  Sensory function in the hands reveals severe dysesthesias in both upper extremities distally from a level of about C6 just below the [place] of the C5 nerve roots.  Skin: Skin is warm.  Psychiatric: He has a normal mood and affect. His behavior is normal. Judgment and thought content normal.     Assessment/Plan Central cord syndrome status post neck injury without fracture or instability causing weakness in the upper extremities with severe dysesthesias.  I discussed the nature of the patient's injury that ultimately he may require surgical intervention but I believe that this can be done on a delayed fashion.  Will work to control his pain have physical therapy evaluate him and plan discharge for then readmission and appropriate time.  Earleen Newport, MD 06/14/2018, 2:53 PM

## 2018-06-14 NOTE — ED Provider Notes (Signed)
Discussed MRI results with Dr. Danielle DessElsner, who will admit for disc protrusion, dysathesias, and upper extremity weakness.   Roxy HorsemanBrowning, Rakia Frayne, PA-C 06/14/18 Wendie [id number]    Raeford RazorKohut, Stephen, MD 06/14/18 [phone]

## 2018-06-14 NOTE — ED Notes (Signed)
Patient transported to MRI 

## 2018-06-15 ENCOUNTER — Encounter (HOSPITAL_COMMUNITY): Payer: Self-pay | Admitting: Physical Medicine and Rehabilitation

## 2018-06-15 DIAGNOSIS — M5020 Other cervical disc displacement, unspecified cervical region: Secondary | ICD-10-CM

## 2018-06-15 DIAGNOSIS — S12001A Unspecified nondisplaced fracture of first cervical vertebra, initial encounter for closed fracture: Secondary | ICD-10-CM

## 2018-06-15 DIAGNOSIS — R739 Hyperglycemia, unspecified: Secondary | ICD-10-CM

## 2018-06-15 DIAGNOSIS — S14129A Central cord syndrome at unspecified level of cervical spinal cord, initial encounter: Principal | ICD-10-CM

## 2018-06-15 DIAGNOSIS — T380X5A Adverse effect of glucocorticoids and synthetic analogues, initial encounter: Secondary | ICD-10-CM

## 2018-06-15 DIAGNOSIS — R52 Pain, unspecified: Secondary | ICD-10-CM

## 2018-06-15 DIAGNOSIS — I10 Essential (primary) hypertension: Secondary | ICD-10-CM

## 2018-06-15 DIAGNOSIS — S34109A Unspecified injury to unspecified level of lumbar spinal cord, initial encounter: Secondary | ICD-10-CM

## 2018-06-15 LAB — HIV ANTIBODY (ROUTINE TESTING W REFLEX): HIV Screen 4th Generation wRfx: NONREACTIVE

## 2018-06-15 MED ORDER — NICOTINE 21 MG/24HR TD PT24
21.0000 mg | MEDICATED_PATCH | Freq: Every day | TRANSDERMAL | Status: DC
  Administered 2018-06-15 – 2018-06-16 (×2): 21 mg via TRANSDERMAL
  Filled 2018-06-15 (×2): qty 1

## 2018-06-15 NOTE — Progress Notes (Signed)
Patient ID: Marcus Adkins, male   DOB: October 19, 1971, 47 y.o.   MRN: [id number] Patient complains of severe dysesthesias in both his hands and throughout his body including his lower extremities this morning.  Continues on the Decadron and Neurontin.  We will see how he does with physical therapy today.  I have asked rehabilitation medicine to evaluate him for possible CIR.  We will reevaluate later this afternoon.

## 2018-06-15 NOTE — Progress Notes (Signed)
Inpatient Rehabilitation  Met with patient at bedside and spoke with significant other via phone per his request to discuss team's recommendation for IP Rehab.  Shared booklets and answered initial questions.  Plan to follow for acute medical plans as Rehab MD recommends CIR post-intervention.  Discussed with bedside RN and charge nurse.  Call if questions.    Carmelia Roller., CCC/SLP Admission Coordinator  [facility]  Cell [phone]

## 2018-06-15 NOTE — Consult Note (Signed)
Physical Medicine and Rehabilitation Consult   Reason for Consult: Central cord syndrome with BUE weakness.  Referring Physician: Dr.Elsner.    HPI: Marcus Adkins is a 47 y.o. male in good health who landed on his neck while jumping and flipping at trampoline park, heard a loud noise and had brief LOC. History taken from chart review and patient. He reported bilateral shoulder and arm pain with numbness and weakness. CT head reviewed, unremarkable for acute intracranial process. Work up revealed C1 transverse process, focal right paraspinous muscle strain, multilevel cervical stenosis, large central disc extrusion C5/C6 with cord deformity and moderate canal stenosis. Dr. Danielle DessElsner evaluated patient and recommended therapy as well as surgery in the future. He was started on decadron to help with inflammation and Neurontin added for severe dysesthesias. He continues to have BUE weakness and CIR recommended by MD.    Review of Systems  Constitutional: Negative for chills and fever.  HENT: Negative for hearing loss and tinnitus.   Eyes: Negative for blurred vision and double vision.  Respiratory: Negative for cough and shortness of breath.   Cardiovascular: Negative for chest pain and palpitations.  Gastrointestinal: Positive for constipation. Negative for heartburn and nausea.  Genitourinary: Negative for dysuria, frequency and urgency.  Musculoskeletal: Positive for joint pain and myalgias.       Bilateral shoulder pain  Skin: Negative for rash.  Neurological: Positive for sensory change, focal weakness and weakness. Negative for dizziness and headaches.  Psychiatric/Behavioral: Negative for memory loss.  All other systems reviewed and are negative.    No past medical history.   No past surgical history.    Family History  Problem Relation Age of Onset  . Diabetes Mother   . Cerebral palsy Sister   . Cerebral palsy Brother     Social History:  Lives with fiancee and 2  young children. 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 He  reports that he smokes 1/2 PPD.   He has never used smokeless tobacco. He does not use alcohol or illicit drugs.     Allergies: No Known Allergies    No medications prior to admission.    Home: Home Living Family/patient expects to be discharged to:: Private residence Living Arrangements: Spouse/significant other, Children Available Help at Discharge: Family, Available 24 hours/day, Available PRN/intermittently Type of Home: House Home Access: Stairs to enter Entergy CorporationEntrance Stairs-Number of Steps: several Entrance Stairs-Rails: Right, Left Home Layout: One level Bathroom Shower/Tub: Engineer, manufacturing systemsTub/shower unit Bathroom Toilet: Standard Home Equipment: None  Functional History: Prior Function Level of Independence: Independent Comments: works Best boyoperating big equipment Functional Status:  Mobility: Bed Mobility General bed mobility comments: NT  up in the chair Transfers Overall transfer level: Needs assistance Transfers: Sit to/from Stand Sit to Stand: Min guard General transfer comment: no use of hands Ambulation/Gait Ambulation/Gait assistance: Min assist, Min guard Gait Distance (Feet): 400 Feet Assistive device: None Gait Pattern/deviations: Step-through pattern General Gait Details: generally steady.  Arms dangling and held stiffly.  Pain increased in the arms, neck and shoulders with gait. Gait velocity interpretation: <1.8 ft/sec, indicate of risk for recurrent falls    ADL:    Cognition: Cognition Overall Cognitive Status: Within Functional Limits for tasks assessed Orientation Level: Oriented X4 Cognition Arousal/Alertness: Awake/alert Behavior During Therapy: WFL for tasks assessed/performed Overall Cognitive Status: Within Functional Limits for tasks assessed   Blood pressure (!) 171/81, pulse 71, temperature 98.1 F (36.7 C), resp. rate 16, height 6\' 2"  (1.88 m), weight 104.3  kg (230 lb), SpO2 98  %. Physical Exam  Nursing note and vitals reviewed. Constitutional: He is oriented to person, place, and time. He appears well-developed and well-nourished.  HENT:  Head: Normocephalic and atraumatic.  Eyes: EOM are normal. Right eye exhibits no discharge. Left eye exhibits no discharge.  Neck:  +C-collar  Cardiovascular: Normal rate and regular rhythm.  Respiratory: Effort normal and breath sounds normal.  GI: Soft. Bowel sounds are normal.  Musculoskeletal:  No edema or tenderness in extremities  Neurological: He is alert and oriented to person, place, and time.  Speech clear.  Motor: RUE: Shoulder abduction, elbow flex/ext, wrist ext 2-/5, hand grip 1+/5 (pain inhibition) LUE: Shoulder abduction, elbow flex/ext, wrist ext 4-/5, hand grip 2+/5 (pain inhibition) B/l LE: 4+/5 proximal to distal Sensation diminished to light touch b/l UE Severe dysesthesias RUE>LUE  Skin: Skin is warm and dry.  Psychiatric: He has a normal mood and affect. His behavior is normal. Thought content normal.    No results found for this or any previous visit (from the past 24 hour(s)). Ct Head Wo Contrast  Result Date: 06/13/2018 CLINICAL DATA:  from trampoline park where pt was jumping and flipping. Pt landed wrong during a flip and landed on his neck. EMS reports bystanders heard a loud "popping" noise when pt landed. Pt reports loss of consciousness. C/o pain to bilateral shoulders and bilateral arms. EXAM: CT HEAD WITHOUT CONTRAST CT CERVICAL SPINE WITHOUT CONTRAST TECHNIQUE: Multidetector CT imaging of the head and cervical spine was performed following the standard protocol without intravenous contrast. Multiplanar CT image reconstructions of the cervical spine were also generated. COMPARISON:  None. FINDINGS: CT HEAD FINDINGS Brain: No evidence of acute infarction, hemorrhage, hydrocephalus, extra-axial collection or mass lesion/mass effect. Vascular: No hyperdense vessel or unexpected calcification.  Skull: Normal. Negative for fracture or focal lesion. Sinuses/Orbits: There is mucosal thickening of the ethmoid sinuses. Other: None CT CERVICAL SPINE FINDINGS Alignment: There is loss of cervical lordosis. This may be secondary to splinting, soft tissue injury, or positioning. Skull base and vertebrae: There is a fracture of the superior aspect of the RIGHT transverse process at C1, involving the UPPER aspect of the transverse foramen and the groove for the vertebral artery. The posterior arch of C1 is intact. No other fractures are identified. Soft tissues and spinal canal: There is low-attenuation within a RIGHT paraspinous muscle at the level of C3. Disc levels:  Mild degenerative changes in the mid cervical spine. Upper chest: Negative. Other: None IMPRESSION: 1. Acute fracture of the RIGHT transverse process of C1 which has a close proximity with the RIGHT vertebral artery. Further evaluation with CT angiography is recommended. 2. Focal low-attenuation within a paraspinous muscle on the RIGHT at the level of C3, likely related to muscle strain. 3. No evidence for acute intracranial abnormality. 4. Sinus disease. Critical Value/emergent results were called by telephone at the time of interpretation on 06/13/2018 at 6:53 pm to Dr. Raeford Razor , who verbally acknowledged these results. Electronically Signed   By: Norva Pavlov M.D.   On: 06/13/2018 18:54   Ct Angio Neck W And/or Wo Contrast  Result Date: 06/13/2018 CLINICAL DATA:  C1 fracture affecting the transverse foramen. Assess for vertebral injury. EXAM: CT ANGIOGRAPHY NECK TECHNIQUE: Multidetector CT imaging of the neck was performed using the standard protocol during bolus administration of intravenous contrast. Multiplanar CT image reconstructions and MIPs were obtained to evaluate the vascular anatomy. Carotid stenosis measurements (when applicable) are obtained utilizing NASCET  criteria, using the distal internal carotid diameter as the  denominator. CONTRAST:  50mL ISOVUE-370 IOPAMIDOL (ISOVUE-370) INJECTION 76% COMPARISON:  CT earlier same day. FINDINGS: Aortic arch: Normal. Branching pattern of the brachiocephalic vessels is normal without origin stenosis. Right carotid system: Common carotid artery is widely patent to the bifurcation. Carotid bifurcation is normal. Cervical ICA is tortuous but widely patent to and through the skull base. Left carotid system: Common carotid artery widely patent to the bifurcation. No carotid bifurcation atherosclerosis. Cervical ICA is tortuous but widely patent to and through the bifurcation. Vertebral arteries: Both vertebral artery origins are widely patent. The right vertebral artery is dominant. Both vertebral arteries are widely patent through the cervical region to the foramen magnum and the basilar. At the site of the right foramen transversarium fracture, the vertebral artery shows the most minimal contour irregularity most consistent with minor focal spasm. No evidence of a definite intimal injury or dissection. Skeleton: Right foramen transversarium fracture at C1 as noted previously. No other cervical spine fracture. Ordinary mild degenerative spondylosis C4-5, C5-6 and C6-7. Other neck: No evidence of canal hematoma. Upper chest: Negative IMPRESSION: At the site of the foramen transversarium fracture of C1 on the right, there is the most minimal contour deformity of the right vertebral artery, most consistent with minimal spasm. I do not see a definite intimal injury. The vessel is widely patent through the region. Electronically Signed   By: Paulina Fusi M.D.   On: 06/13/2018 19:51   Ct Cervical Spine Wo Contrast  Result Date: 06/13/2018 CLINICAL DATA:  from trampoline park where pt was jumping and flipping. Pt landed wrong during a flip and landed on his neck. EMS reports bystanders heard a loud "popping" noise when pt landed. Pt reports loss of consciousness. C/o pain to bilateral shoulders  and bilateral arms. EXAM: CT HEAD WITHOUT CONTRAST CT CERVICAL SPINE WITHOUT CONTRAST TECHNIQUE: Multidetector CT imaging of the head and cervical spine was performed following the standard protocol without intravenous contrast. Multiplanar CT image reconstructions of the cervical spine were also generated. COMPARISON:  None. FINDINGS: CT HEAD FINDINGS Brain: No evidence of acute infarction, hemorrhage, hydrocephalus, extra-axial collection or mass lesion/mass effect. Vascular: No hyperdense vessel or unexpected calcification. Skull: Normal. Negative for fracture or focal lesion. Sinuses/Orbits: There is mucosal thickening of the ethmoid sinuses. Other: None CT CERVICAL SPINE FINDINGS Alignment: There is loss of cervical lordosis. This may be secondary to splinting, soft tissue injury, or positioning. Skull base and vertebrae: There is a fracture of the superior aspect of the RIGHT transverse process at C1, involving the UPPER aspect of the transverse foramen and the groove for the vertebral artery. The posterior arch of C1 is intact. No other fractures are identified. Soft tissues and spinal canal: There is low-attenuation within a RIGHT paraspinous muscle at the level of C3. Disc levels:  Mild degenerative changes in the mid cervical spine. Upper chest: Negative. Other: None IMPRESSION: 1. Acute fracture of the RIGHT transverse process of C1 which has a close proximity with the RIGHT vertebral artery. Further evaluation with CT angiography is recommended. 2. Focal low-attenuation within a paraspinous muscle on the RIGHT at the level of C3, likely related to muscle strain. 3. No evidence for acute intracranial abnormality. 4. Sinus disease. Critical Value/emergent results were called by telephone at the time of interpretation on 06/13/2018 at 6:53 pm to Dr. Raeford Razor , who verbally acknowledged these results. Electronically Signed   By: Norva Pavlov M.D.   On:  06/13/2018 18:54   Mr Cervical Spine Wo  Contrast  Result Date: 06/14/2018 CLINICAL DATA:  Neck pain after trampoline injury EXAM: MRI CERVICAL SPINE WITHOUT CONTRAST TECHNIQUE: Multiplanar, multisequence MR imaging of the cervical spine was performed. No intravenous contrast was administered. COMPARISON:  CTA neck 06/13/2018 FINDINGS: Alignment: Normal. Vertebrae: No vertebral body height loss or focal marrow edema. The C1 fracture demonstrated on the CT scan is not visible on this study. There is a small prevertebral effusion extending from C1-2 to C5. No ligamentous disruption. Cord: Normal caliber and signal. Posterior Fossa, vertebral arteries, paraspinal tissues: Visualized posterior fossa is normal. Vertebral artery flow voids are preserved. Disc levels: The C1-2 articulation is normal. C2-C3: Small central disc protrusion without stenosis. C3-C4: Small disc bulge narrows the ventral thecal sac. Mild spinal canal stenosis. Mild narrowing of the left neural foramen. C4-C5: Small disc bulge with mild narrowing of the spinal canal and left neural foramen. C5-C6: Large central disc extrusion with superior migration to the C5 pedicle level. This moderately narrows the spinal canal and deforms the spinal cord. No neural foraminal stenosis. C6-C7: Medium-sized central disc extrusion with inferior migration narrowing the ventral thecal sac and indenting the right anterior aspect of the spinal cord. Mild central spinal canal stenosis. No neural impingement. C7-T1: Normal. T1-T3 is imaged in the sagittal plane only, with no visible disc herniation or stenosis. IMPRESSION: 1. Known C1 fracture is not demonstrated on this study. No ligamentous disruption. Small prevertebral effusion of the upper cervical spine. 2. Multilevel spinal canal stenosis, worst at C5-C6 where there is moderate central stenosis and deformity of the spinal cord without signal change. Electronically Signed   By: Deatra Robinson M.D.   On: 06/14/2018 01:29   Assessment/Plan: Diagnosis:  Central cord syndrome with incomplete paraparesis Labs and images (see above) independently reviewed.  Records reviewed and summated above.  1. Does the need for close, 24 hr/day medical supervision in concert with the patient's rehab needs make it unreasonable for this patient to be served in a less intensive setting? Potentially 2. Co-Morbidities requiring supervision/potential complications: HTN (monitor and provide prns in accordance with increased physical exertion and pain), steroid induced hyperglycemia (Monitor in accordance with exercise and adjust meds as necessary), pain (Biofeedback training with therapies to help reduce reliance on opiate pain medications, particularly IV morphine, monitor pain control during therapies, and sedation at rest and titrate to maximum efficacy to ensure participation and gains in therapies) 3. Due to safety, skin/wound care, disease management, pain management and patient education, does the patient require 24 hr/day rehab nursing? Yes 4. Does the patient require coordinated care of a physician, rehab nurse, PT (1-2 hrs/day, 5 days/week), OT (1-2 hrs/day, 5 days/week) and SLP (1-2 hrs/day, 5 days/week) to address physical and functional deficits in the context of the above medical diagnosis(es)? Yes Addressing deficits in the following areas: balance, endurance, locomotion, strength, transferring, bathing, dressing, toileting, swallowing and psychosocial support 5. Can the patient actively participate in an intensive therapy program of at least 3 hrs of therapy per day at least 5 days per week? Potentially 6. The potential for patient to make measurable gains while on inpatient rehab is good 7. Anticipated functional outcomes upon discharge from inpatient rehab are modified independent and supervision  with PT, supervision and min assist with OT, independent and modified independent with SLP. 8. Estimated rehab length of stay to reach the above functional goals  is: 4-7 days 9. Anticipated D/C setting: Home 10. Anticipated post D/C  treatments: HH therapy and Home excercise program 11. Overall Rehab/Functional Prognosis: excellent  RECOMMENDATIONS: This patient's condition is appropriate for continued rehabilitative care in the following setting: Will monitor progress in therapies and await plans for surgical intervention.  Pt appears to be doing relatively well on day of evaluation with plans for surgery in future.  If pt continues to do well with therapies, he may be better served with CIR post-intervention. Patient has agreed to participate in recommended program. Potentially Note that insurance prior authorization may be required for reimbursement for recommended care.  Comment: Rehab Admissions Coordinator to follow up.   I have personally performed a face to face diagnostic evaluation, including, but not limited to relevant history and physical exam findings, of this patient and developed relevant assessment and plan.  Additionally, I have reviewed and concur with the physician assistant's documentation above.   Maryla Morrow, MD, ABPMR Delle Reining, PA-C 06/15/2018

## 2018-06-15 NOTE — Progress Notes (Signed)
Physical Therapy Treatment Patient Details Name: Marcus Adkins MRN: [id number] DOB: 08-Dec-1970 Today's Date: 06/15/2018    History of Present Illness pt is a 47 y/o male who suffured a spinal cord injury sustained at a trampoline park when he misjudged a somersault landing on his neck and shoulders.  He immediately noted paresis/paralysis in bil UE and LE's.  CT showed C1 fx, MRI shows mild to moderate stenosis C2-C7.    PT Comments    Pt progressing with overall mobility, ambulated over 400' with supervision as well as ascending and descending a flight of stairs. However, no noted improvement in use of UE's, continues to have pain, dysesthesias, and weakness. Recommend waiting for CIR until after pt has surgery as this would allow for maximum recovery. If pt is going to go home before surgery or not have surgery, recommend outpt PT when more time has gone by to allow for nerve healing. PT will continue to follow.    Follow Up Recommendations  Other (comment)(recommend CIR if pt is going to have surgery)     Equipment Recommendations  None recommended by PT    Recommendations for Other Services Rehab consult     Precautions / Restrictions Precautions Precautions: Cervical Precaution Booklet Issued: No Precaution Comments: discussed proper posture Required Braces or Orthoses: Cervical Brace Cervical Brace: Hard collar;At all times Restrictions Weight Bearing Restrictions: No    Mobility  Bed Mobility Overal bed mobility: Needs Assistance Bed Mobility: Supine to Sit;Sit to Supine     Supine to sit: Supervision Sit to supine: Supervision   General bed mobility comments: [facility] and return to same position, discussed avoiding fetal position in bed to keep neck in proper alignment  Transfers Overall transfer level: Needs assistance Equipment used: None Transfers: Sit to/from Stand Sit to Stand: Supervision         General transfer  comment: pt stood safely, but reports increased pain in hands when he gets up and moves  Ambulation/Gait Ambulation/Gait assistance: Min guard Gait Distance (Feet): 500 Feet Assistive device: None Gait Pattern/deviations: Step-through pattern Gait velocity: decreased Gait velocity interpretation: 1.31 - 2.62 ft/sec, indicative of limited community ambulator General Gait Details: placed hands in different positions while ambulating to try to decrease pain but when pt held hands up with elbows bent had pain between shoulder blades, preferred arms down by his sides but no arm swing and  felt numbness and tingling   Stairs Stairs: Yes Stairs assistance: Min guard Stair Management: One rail Left;Alternating pattern;Forwards Number of Stairs: 10 General stair comments: pt with sufficient LE strength to navigate stairs with minimal use of L hand on rail.    Wheelchair Mobility    Modified Rankin (Stroke Patients Only)       Balance Overall balance assessment: Needs assistance Sitting-balance support: No upper extremity supported Sitting balance-Leahy Scale: Normal     Standing balance support: No upper extremity supported Standing balance-Leahy Scale: Fair(to good) Standing balance comment: balance limited by pain and neck brace                            Cognition Arousal/Alertness: Awake/alert Behavior During Therapy: WFL for tasks assessed/performed Overall Cognitive Status: Within Functional Limits for tasks assessed  Exercises      General Comments General comments (skin integrity, edema, etc.): pt's UE's hypersensitive to touch, tolerated deep pressure better than light touch, instructed him to work on deep pressure to each arm and get his girlfriend to help him with this      Pertinent Vitals/Pain Pain Assessment: Faces Faces Pain Scale: Hurts even more Pain Location: UE's, Neck Pain Descriptors /  Indicators: Pins and needles;Tingling;Discomfort;Burning Pain Intervention(s): Limited activity within patient's tolerance;Monitored during session    Home Living                      Prior Function            PT Goals (current goals can now be found in the care plan section) Acute Rehab PT Goals Patient Stated Goal: back to work,  my (UE) function back PT Goal Formulation: With patient Time For Goal Achievement: 06/28/18 Potential to Achieve Goals: Good Progress towards PT goals: Progressing toward goals    Frequency    Min 4X/week      PT Plan Current plan remains appropriate    Co-evaluation              AM-PAC PT "6 Clicks" Daily Activity  Outcome Measure  Difficulty turning over in bed (including adjusting bedclothes, sheets and blankets)?: A Little Difficulty moving from lying on back to sitting on the side of the bed? : A Little Difficulty sitting down on and standing up from a chair with arms (e.g., wheelchair, bedside commode, etc,.)?: A Little Help needed moving to and from a bed to chair (including a wheelchair)?: A Little Help needed walking in hospital room?: A Little Help needed climbing 3-5 steps with a railing? : A Little 6 Click Score: 18    End of Session Equipment Utilized During Treatment: Gait belt Activity Tolerance: Patient tolerated treatment well Patient left: with call bell/phone within reach;in bed Nurse Communication: Mobility status PT Visit Diagnosis: Other abnormalities of gait and mobility (R26.89);Pain;Other symptoms and signs involving the nervous system (R29.898) Pain - Right/Left: (BIL) Pain - part of body: Shoulder;Arm;Hand     Time: 1538-1600 PT Time Calculation (min) (ACUTE ONLY): 22 min  Charges:  $Gait Training: 8-22 mins                    G Codes:       Lyanne Co, PT  Acute Rehab Services  [phone]    [address] 06/15/2018, 4:52 PM

## 2018-06-16 MED ORDER — GABAPENTIN 600 MG PO TABS: 300.0000 mg | ORAL_TABLET | Freq: Three times a day (TID) | ORAL | 5 refills | Status: AC

## 2018-06-16 MED ORDER — DEXAMETHASONE 1 MG PO TABS: ORAL_TABLET | ORAL | 0 refills | Status: DC

## 2018-06-16 MED ORDER — IBUPROFEN 200 MG PO TABS
600.0000 mg | ORAL_TABLET | Freq: Four times a day (QID) | ORAL | Status: DC | PRN
  Administered 2018-06-16: 600 mg via ORAL
  Filled 2018-06-16: qty 3
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
## 2018-06-16 NOTE — Progress Notes (Signed)
Kim called back and will speak to Dr. Danielle DessElsner.  Nurse updated patient and family.
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
## 2018-06-16 NOTE — Progress Notes (Signed)
Pt is medically stable for discharge, will have to come back for surgery at a later date.   [place] and needs his significant other for his wallet and keys to get in the house.   Fiance gets off at 1700 and will come and take patient home.   Pt to be discharged home with home health for PT and OT.

## 2018-06-16 NOTE — Progress Notes (Addendum)
Nurse called Dr. Danielle DessElsner to tell him that reconciliation has not been completed = unable to print AVS for discharge.   Doctor stated "he was not in the hospital and he would have to wait to be discharged".  Patient's ride is here.  AC (Ron) was called and told Nurse to call Doctor again.   On call was called to leave a message for Dr. Danielle DessElsner.

## 2018-06-16 NOTE — Progress Notes (Signed)
Inpatient Rehabilitation-Admissions Coordinator   [facility] improvement in functional mobility with PT yesterday (7/16) with pt ambulating 500 feet Min guard and Supervision for sit to stand transfers. Pt no longer has CIR needs based on functional improvement. Pt plans to return home with possible follow up surgery. AC will sign off at this time; please re-consult if pt has functional decline or has surgery during this hospital admission with new CIR needs.   Please call if questions.   Nanine MeansKelly Donley Harland, OTR/L  Rehab Admissions Coordinator  [phone] 06/16/2018 12:03 PM

## 2018-06-16 NOTE — Discharge Summary (Signed)
Physician Discharge Summary  Patient ID: Marcus Adkins MRN: [id number] DOB/AGE: Dec 20, 1970 47 y.o.  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  He had severe dysesthesias.  He was started on medication MRI of the cervical spine demonstrated that the patient had moderately severe stenosis at C5-6 and C6-7 without any cord contusion noted however clinically he presented with a central cord syndrome.  His symptoms continued and gradually responded to some low-dose steroids in addition to gabapentin.  He will likely require surgical decompression in a delayed fashion but for the time being he will be discharged home with home health occupational therapy.  Consults: Rehabilitation medicine  Significant Diagnostic Studies: None  Treatments: Occupational Therapy, physical therapy  Discharge Exam: Blood pressure (!) 149/97, pulse (!) 59, temperature 97.9 F (36.6 C), temperature source Oral, resp. rate 16, height 6\' 2"  (1.88 m), weight 104.3 kg (230 lb), SpO2 90 %. Patient is awake and alert.  Right hand strength reveals decreased grip to 0 out of 5 intrinsic strength at 1 out of 5 biceps strength of 4 out of 5 triceps strength at 3 out of 5.  Left upper extremity strength reveals decreased grip at 4 out of 5 intrinsic strength of 4 out of 5 biceps strength of 4 out of 5 triceps strength of 4 out of 5.  Station and gait appear intact there is significant hyperreflexia in the upper and  lower extremities.  Disposition: Discharge disposition: 01-Home or Self Care       Discharge Instructions    Call MD for:  severe uncontrolled pain   Complete by:  As directed    Call MD for:  temperature >100.4   Complete by:  As directed    Diet - low sodium heart healthy   Complete by:  As directed    Increase activity slowly   Complete by:  As directed      Allergies as of 06/16/2018   No Known Allergies     Medication List    TAKE these medications   dexamethasone 1 MG tablet Commonly known as:  DECADRON 2 tablets twice daily for 2 days, one tablet twice daily for 2 days, one tablet daily for 2 days.   gabapentin 600 MG tablet Commonly known as:  NEURONTIN Take 0.5 tablets (300 mg total) by mouth 3 (three) times daily.        SignedStefani Dama: Altan Kraai J 06/16/2018, 8:57 AM

## 2018-06-22 ENCOUNTER — Encounter (HOSPITAL_COMMUNITY): Payer: Self-pay

## 2018-07-09 ENCOUNTER — Other Ambulatory Visit (HOSPITAL_BASED_OUTPATIENT_CLINIC_OR_DEPARTMENT_OTHER): Payer: Self-pay | Admitting: Neurological Surgery

## 2018-07-09 DIAGNOSIS — S14129D Central cord syndrome at unspecified level of cervical spinal cord, subsequent encounter: Secondary | ICD-10-CM

## 2018-07-17 ENCOUNTER — Ambulatory Visit (HOSPITAL_BASED_OUTPATIENT_CLINIC_OR_DEPARTMENT_OTHER)
Admission: RE | Admit: 2018-07-17 | Discharge: 2018-07-17 | Disposition: A | Discharge status: Home / Self Care | Payer: Commercial Managed Care - PPO | Source: Ambulatory Visit | Attending: Neurological Surgery | Admitting: Neurological Surgery

## 2018-07-17 DIAGNOSIS — M7989 Other specified soft tissue disorders: Secondary | ICD-10-CM | POA: Insufficient documentation

## 2018-07-17 DIAGNOSIS — S14129D Central cord syndrome at unspecified level of cervical spinal cord, subsequent encounter: Secondary | ICD-10-CM | POA: Insufficient documentation

## 2018-07-17 DIAGNOSIS — M4802 Spinal stenosis, cervical region: Secondary | ICD-10-CM | POA: Diagnosis not present

## 2018-07-17 DIAGNOSIS — X58XXXD Exposure to other specified factors, subsequent encounter: Secondary | ICD-10-CM | POA: Diagnosis not present

## 2018-07-17 DIAGNOSIS — M50222 Other cervical disc displacement at C5-C6 level: Secondary | ICD-10-CM | POA: Insufficient documentation

## 2018-11-11 ENCOUNTER — Other Ambulatory Visit (HOSPITAL_COMMUNITY): Payer: Self-pay | Admitting: Neurological Surgery

## 2018-11-11 ENCOUNTER — Other Ambulatory Visit: Payer: Self-pay | Admitting: Neurological Surgery

## 2018-11-11 DIAGNOSIS — S14129D Central cord syndrome at unspecified level of cervical spinal cord, subsequent encounter: Secondary | ICD-10-CM

## 2018-11-17 MED ORDER — ONDANSETRON HCL 40 MG/20ML IJ SOLN: 4.0000 mg | Freq: Four times a day (QID) | INTRAMUSCULAR | Status: AC | PRN

## 2018-12-10 ENCOUNTER — Other Ambulatory Visit: Payer: Self-pay | Admitting: Neurological Surgery

## 2018-12-10 DIAGNOSIS — S14129D Central cord syndrome at unspecified level of cervical spinal cord, subsequent encounter: Secondary | ICD-10-CM

## 2018-12-16 ENCOUNTER — Other Ambulatory Visit (HOSPITAL_COMMUNITY): Payer: Commercial Managed Care - PPO

## 2018-12-16 ENCOUNTER — Ambulatory Visit (HOSPITAL_COMMUNITY): Payer: Commercial Managed Care - PPO

## 2018-12-16 ENCOUNTER — Encounter (HOSPITAL_COMMUNITY): Payer: Self-pay

## 2018-12-29 ENCOUNTER — Other Ambulatory Visit: Payer: Self-pay | Admitting: Neurological Surgery

## 2018-12-29 ENCOUNTER — Ambulatory Visit (HOSPITAL_COMMUNITY)
Admission: RE | Admit: 2018-12-29 | Discharge: 2018-12-29 | Disposition: A | Discharge status: Home / Self Care | Payer: Commercial Managed Care - PPO | Source: Ambulatory Visit | Attending: Neurological Surgery | Admitting: Neurological Surgery

## 2018-12-29 DIAGNOSIS — M4802 Spinal stenosis, cervical region: Secondary | ICD-10-CM | POA: Insufficient documentation

## 2018-12-29 DIAGNOSIS — S14129D Central cord syndrome at unspecified level of cervical spinal cord, subsequent encounter: Secondary | ICD-10-CM

## 2018-12-29 NOTE — Discharge Instructions (Signed)
Myelogram, Care After ° °These instructions give you information about caring for yourself after your procedure. Your doctor may also give you more specific instructions. Call your doctor if you have any problems or questions after your procedure. °Follow these instructions at home: °· Drink enough fluid to keep your pee (urine) clear or pale yellow. °· Rest as told by your doctor. °· Lie flat with your head slightly raised (elevated). °· Do not bend, lift, or do any hard activities for 24-48 hours or as told by your doctor. °· Take over-the-counter and prescription medicines only as told by your doctor. °· Take care of and remove your bandage (dressing) as told by your doctor. °· Bathe or shower as told by your doctor. °Contact a health care provider if: °· You have a fever. °· You have a headache that lasts longer than 24 hours. °· You feel sick to your stomach (nauseous). °· You throw up (vomit). °· Your neck is stiff. °· Your legs feel numb. °· You cannot pee. °· You cannot poop (have a bowel movement). °· You have a rash. °· You are itchy or sneezing. °Get help right away if: °· You have new symptoms or your symptoms get worse. °· You have a seizure. °· You have trouble breathing. °This information is not intended to replace advice given to you by your health care provider. Make sure you discuss any questions you have with your health care provider. °Document Released: 08/26/2008 Document Revised: 07/17/2016 Document Reviewed: 08/30/2015 °Elsevier Interactive Patient Education © 2019 [employer] ° °

## 2019-01-05 ENCOUNTER — Encounter (HOSPITAL_COMMUNITY): Payer: Self-pay

## 2019-01-05 ENCOUNTER — Ambulatory Visit (HOSPITAL_COMMUNITY): Payer: Commercial Managed Care - PPO

## 2019-01-05 ENCOUNTER — Other Ambulatory Visit (HOSPITAL_COMMUNITY): Payer: Commercial Managed Care - PPO
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
